# Patient Record
Sex: Male | Born: 1965 | Race: Black or African American | Hispanic: No | Marital: Married | Smoking: Never smoker
Health system: Southern US, Community
[De-identification: ages and names within clinical notes are randomized; demographics above are authoritative.]

---

## 2011-01-29 ENCOUNTER — Encounter: Payer: Self-pay | Admitting: Emergency Medicine

## 2011-01-29 ENCOUNTER — Emergency Department (HOSPITAL_COMMUNITY)
Admission: EM | Admit: 2011-01-29 | Discharge: 2011-01-29 | Disposition: A | Discharge status: Home / Self Care | Payer: PRIVATE HEALTH INSURANCE | Attending: Emergency Medicine | Admitting: Emergency Medicine

## 2011-01-29 DIAGNOSIS — R109 Unspecified abdominal pain: Secondary | ICD-10-CM | POA: Insufficient documentation

## 2011-01-29 DIAGNOSIS — R197 Diarrhea, unspecified: Secondary | ICD-10-CM

## 2011-01-29 LAB — COMPREHENSIVE METABOLIC PANEL
ALT: 20 U/L (ref 0–53)
AST: 19 U/L (ref 0–37)
CO2: 31 mEq/L (ref 19–32)
Calcium: 9.3 mg/dL (ref 8.4–10.5)
GFR calc Af Amer: 90 mL/min — ABNORMAL LOW (ref >90)
GFR calc non Af Amer: 77 mL/min — ABNORMAL LOW (ref >90)
Sodium: 140 mEq/L (ref 135–145)

## 2011-01-29 LAB — CBC
MCV: 82.6 fL (ref 78.0–100.0)
Platelets: 248 10*3/uL (ref 150–400)
RDW: 14.7 % (ref 11.5–15.5)
WBC: 3.7 10*3/uL — ABNORMAL LOW (ref 4.0–10.5)

## 2011-01-29 LAB — DIFFERENTIAL
Basophils Absolute: 0 10*3/uL (ref 0.0–0.1)
Eosinophils Relative: 6 % — ABNORMAL HIGH (ref 0–5)
Lymphocytes Relative: 48 % — ABNORMAL HIGH (ref 12–46)
Neutro Abs: 1.3 10*3/uL — ABNORMAL LOW (ref 1.7–7.7)
Neutrophils Relative %: 36 % — ABNORMAL LOW (ref 43–77)

## 2011-01-29 MED ORDER — LOPERAMIDE HCL 2 MG PO CAPS: 2.0000 mg | ORAL_CAPSULE | Freq: Four times a day (QID) | ORAL | Status: AC | PRN

## 2011-01-29 NOTE — ED Provider Notes (Signed)
History   This chart was scribed for Nicholes Stairs, MD by Clarita Crane. The patient was seen in room APA06/APA06 and the patient's care was started at 11:36AM.   CSN: [id number] Arrival date & time: 01/29/2011 10:33 AM   First MD Initiated Contact with Patient 01/29/11 1108      Chief Complaint  Patient presents with  . Abdominal Pain  . Diarrhea    HPI Allen Patterson is a 45 y.o. male who presents to the Emergency Department complaining of intermittent non-radiating diffuse abdominal pain onset 2 days ago and persistent since but improved this morning with associated diarrhea described as red in color. Denies nausea, vomiting, fever, chills. Denies abdominal surgery, recent sick contacts.  History reviewed. No pertinent past medical history.  History reviewed. No pertinent past surgical history.  History reviewed. No pertinent family history.  History  Substance Use Topics  . Smoking status: Never Smoker   . Smokeless tobacco: Not on file  . Alcohol Use: No      Review of Systems 10 Systems reviewed and are negative for acute change except as noted in the HPI.  Allergies  Review of patient's allergies indicates no known allergies.  Home Medications   Current Outpatient Rx  Name Route Sig Dispense Refill  . INFLUENZA VIRUS VACC SPLIT PF IM SUSP Intramuscular Inject 0.5 mLs into the muscle once.      Marland Kitchen OVER THE COUNTER MEDICATION Oral Take 2 tablets by mouth at bedtime as needed. Pain/insomnia       BP 163/97  Pulse 75  Temp(Src) 98.2 F (36.8 C) (Oral)  Resp 17  Ht 5\' 11"  (1.803 m)  Wt 225 lb (102.059 kg)  BMI 31.38 kg/m2  SpO2 100%  Physical Exam  Nursing note and vitals reviewed. Constitutional: He is oriented to person, place, and time. He appears well-developed and well-nourished. No distress.  HENT:  Head: Normocephalic and atraumatic.  Eyes: Conjunctivae and EOM are normal.  Neck: Neck supple. No tracheal deviation present.  Cardiovascular:  Normal rate and regular rhythm.  Exam reveals no friction rub.   No murmur heard. Pulmonary/Chest: Effort normal and breath sounds normal. No respiratory distress. He has no wheezes. He has no rales.  Abdominal: Soft. Bowel sounds are normal. He exhibits no distension. There is no tenderness.  Musculoskeletal: Normal range of motion. He exhibits no edema.  Neurological: He is alert and oriented to person, place, and time. No sensory deficit.  Skin: Skin is warm and dry.  Psychiatric: He has a normal mood and affect. His behavior is normal.    ED Course  Procedures (including critical care time)  DIAGNOSTIC STUDIES: Oxygen Saturation is 100% on room air, normal by my interpretation.    COORDINATION OF CARE:    Labs Reviewed  CBC - Abnormal; Notable for the following:    WBC 3.7 (*)    All other components within normal limits  DIFFERENTIAL - Abnormal; Notable for the following:    Neutrophils Relative 36 (*)    Neutro Abs 1.3 (*)    Lymphocytes Relative 48 (*)    Eosinophils Relative 6 (*)    All other components within normal limits  COMPREHENSIVE METABOLIC PANEL - Abnormal; Notable for the following:    Glucose, Bld 108 (*)    Total Bilirubin 0.2 (*)    GFR calc non Af Amer 77 (*)    GFR calc Af Amer 90 (*)    All other components within normal limits   No  results found.   No diagnosis found.    MDM  Diarrhea Resolved No abdominal pain now.  No fevers, no recent antibiotic use.       I personally performed the services described in this documentation, which was scribed in my presence. The recorded information has been reviewed and considered.    Nicholes Stairs, MD 01/29/11 1149

## 2011-01-29 NOTE — ED Notes (Signed)
Pt started having L side abd pain with mooshy diarrhea x 2 days ago. Pain to L side abd only with the bm's, states it starts to cramp. States his bm is a redish color but no red in toilet water. . Mm wet. Eating/drinking wnl.

## 2013-05-09 ENCOUNTER — Encounter (HOSPITAL_COMMUNITY): Payer: Self-pay | Admitting: Emergency Medicine

## 2013-05-09 ENCOUNTER — Emergency Department (HOSPITAL_COMMUNITY): Payer: PRIVATE HEALTH INSURANCE

## 2013-05-09 ENCOUNTER — Emergency Department (HOSPITAL_COMMUNITY)
Admission: EM | Admit: 2013-05-09 | Discharge: 2013-05-09 | Disposition: A | Discharge status: Home / Self Care | Payer: PRIVATE HEALTH INSURANCE | Attending: Emergency Medicine | Admitting: Emergency Medicine

## 2013-05-09 DIAGNOSIS — R51 Headache: Secondary | ICD-10-CM | POA: Insufficient documentation

## 2013-05-09 DIAGNOSIS — J069 Acute upper respiratory infection, unspecified: Secondary | ICD-10-CM

## 2013-05-09 MED ORDER — PSEUDOEPHEDRINE HCL 60 MG PO TABS
60.0000 mg | ORAL_TABLET | Freq: Once | ORAL | Status: AC
  Administered 2013-05-09: 60 mg via ORAL
  Filled 2013-05-09: qty 1

## 2013-05-09 MED ORDER — IBUPROFEN 800 MG PO TABS
800.0000 mg | ORAL_TABLET | Freq: Once | ORAL | Status: AC
  Administered 2013-05-09: 800 mg via ORAL
  Filled 2013-05-09: qty 1

## 2013-05-09 MED ORDER — PROMETHAZINE-CODEINE 6.25-10 MG/5ML PO SYRP
5.0000 mL | ORAL_SOLUTION | ORAL | Status: DC | PRN
Start: 2013-05-09 — End: 2014-09-07

## 2013-05-09 MED ORDER — LORATADINE-PSEUDOEPHEDRINE ER 5-120 MG PO TB12: 1.0000 | ORAL_TABLET | Freq: Two times a day (BID) | ORAL | Status: DC

## 2013-05-09 NOTE — Discharge Instructions (Signed)
Please increase fluids. Please wash hands frequently. Please use ibuprofen every 6 hours for fever and aching. Please use Claritin-D 2 times daily or every 12 hours for congestion. Use promethazine with codeine cough medication every 4 hours if needed for cough . Please use your mask until symptoms have resolved. Upper Respiratory Infection, Adult An upper respiratory infection (URI) is also known as the common cold. It is often caused by a type of germ (virus). Colds are easily spread (contagious). You can pass it to others by kissing, coughing, sneezing, or drinking out of the same glass. Usually, you get better in 1 or 2 weeks.  HOME CARE   Only take medicine as told by your doctor.  Use a warm mist humidifier or breathe in steam from a hot shower.  Drink enough water and fluids to keep your pee (urine) clear or pale yellow.  Get plenty of rest.  Return to work when your temperature is back to normal or as told by your doctor. You may use a face mask and wash your hands to stop your cold from spreading. GET HELP RIGHT AWAY IF:   After the first few days, you feel you are getting worse.  You have questions about your medicine.  You have chills, shortness of breath, or brown or red spit (mucus).  You have yellow or brown snot (nasal discharge) or pain in the face, especially when you bend forward.  You have a fever, puffy (swollen) neck, pain when you swallow, or white spots in the back of your throat.  You have a bad headache, ear pain, sinus pain, or chest pain.  You have a high-pitched whistling sound when you breathe in and out (wheezing).  You have a lasting cough or cough up blood.  You have sore muscles or a stiff neck. MAKE SURE YOU:   Understand these instructions.  Will watch your condition.  Will get help right away if you are not doing well or get worse. Document Released: 09/17/2007 Document Revised: 06/23/2011 Document Reviewed: 08/05/2010 [facility] Patient  Information 2014 [place]Care, [place].

## 2013-05-09 NOTE — ED Notes (Addendum)
Cough, fatigue and fever per pt 7 days.

## 2013-05-09 NOTE — ED Provider Notes (Signed)
CSN: [id number]     Arrival date & time 05/09/13  1348 History   First MD Initiated Contact with Patient 05/09/13 1616     Chief Complaint  Patient presents with  . Influenza   (Consider location/radiation/quality/duration/timing/severity/associated sxs/prior Treatment) Patient is a 48 y.o. male presenting with flu symptoms. The history is provided by the patient.  Influenza Presenting symptoms: cough, fatigue, fever and headache   Presenting symptoms: no nausea, no shortness of breath and no vomiting   Severity:  Moderate Progression:  Worsening Chronicity:  New Relieved by:  Nothing Ineffective treatments:  OTC medications Associated symptoms: chills, decreased physical activity and nasal congestion     History reviewed. No pertinent past medical history. History reviewed. No pertinent past surgical history. No family history on file. History  Substance Use Topics  . Smoking status: Never Smoker   . Smokeless tobacco: Not on file  . Alcohol Use: No    Review of Systems  Constitutional: Positive for fever, chills and fatigue. Negative for activity change.       All ROS Neg except as noted in HPI  HENT: Positive for congestion. Negative for nosebleeds.   Eyes: Negative for photophobia and discharge.  Respiratory: Positive for cough. Negative for shortness of breath and wheezing.   Cardiovascular: Negative for chest pain and palpitations.  Gastrointestinal: Negative for nausea, vomiting, abdominal pain and blood in stool.  Genitourinary: Negative for dysuria, frequency and hematuria.  Musculoskeletal: Negative for arthralgias, back pain and neck pain.  Skin: Negative.   Neurological: Positive for headaches. Negative for dizziness, seizures and speech difficulty.  Psychiatric/Behavioral: Negative for hallucinations and confusion.    Allergies  Review of patient's allergies indicates no known allergies.  Home Medications  No current outpatient prescriptions on file. BP  173/99  Pulse 83  Temp(Src) 99.2 F (37.3 C) (Oral)  Resp 18  SpO2 99% Physical Exam  Nursing note and vitals reviewed. Constitutional: He is oriented to person, place, and time. He appears well-developed and well-nourished.  Non-toxic appearance.  HENT:  Head: Normocephalic.  Right Ear: Tympanic membrane and external ear normal.  Left Ear: Tympanic membrane and external ear normal.  Nasal congestion  Eyes: EOM and lids are normal. Pupils are equal, round, and reactive to light.  Neck: Normal range of motion. Neck supple. Carotid bruit is not present.  Cardiovascular: Normal rate, regular rhythm, normal heart sounds, intact distal pulses and normal pulses.   Pulmonary/Chest: Breath sounds normal. No respiratory distress.  Few scattered rhonchi that clear with cough.  Abdominal: Soft. Bowel sounds are normal. There is no tenderness. There is no guarding.  Musculoskeletal: Normal range of motion.  Lymphadenopathy:       Head (right side): No submandibular adenopathy present.       Head (left side): No submandibular adenopathy present.    He has no cervical adenopathy.  Neurological: He is alert and oriented to person, place, and time. He has normal strength. No cranial nerve deficit or sensory deficit.  Skin: Skin is warm and dry.  Psychiatric: He has a normal mood and affect. His speech is normal.    ED Course  Procedures (including critical care time) Labs Review Labs Reviewed - No data to display Imaging Review No results found.  EKG Interpretation   None       MDM  No diagnosis found. *I have reviewed nursing notes, vital signs, and all appropriate lab and imaging results for this patient.**  Pt has URI. Pt advised to use  claritin D, Promethazine cough medication, and ibuprofen. Pt to see his primary or return to the ED if any problems.  Kathie DikeHobson M Daisha Filosa, PA-C 05/09/13 1655

## 2013-05-09 NOTE — ED Notes (Signed)
Sick for 1 week, cough with green sputum, Feels sob at times.  Had  Diarrhea today. No vomiting.

## 2013-05-09 NOTE — ED Provider Notes (Signed)
Medical screening examination/treatment/procedure(s) were performed by non-physician practitioner and as supervising physician I was immediately available for consultation/collaboration.  EKG Interpretation   None         Mohsen Odenthal L Anaisabel Pederson, MD 05/09/13 2027 

## 2014-09-07 ENCOUNTER — Emergency Department (HOSPITAL_COMMUNITY): Payer: PRIVATE HEALTH INSURANCE

## 2014-09-07 ENCOUNTER — Emergency Department (HOSPITAL_COMMUNITY)
Admission: EM | Admit: 2014-09-07 | Discharge: 2014-09-07 | Disposition: A | Discharge status: Home / Self Care | Payer: PRIVATE HEALTH INSURANCE | Attending: Emergency Medicine | Admitting: Emergency Medicine

## 2014-09-07 ENCOUNTER — Encounter (HOSPITAL_COMMUNITY): Payer: Self-pay

## 2014-09-07 DIAGNOSIS — Z79899 Other long term (current) drug therapy: Secondary | ICD-10-CM | POA: Insufficient documentation

## 2014-09-07 DIAGNOSIS — G8929 Other chronic pain: Secondary | ICD-10-CM | POA: Diagnosis not present

## 2014-09-07 DIAGNOSIS — M25561 Pain in right knee: Secondary | ICD-10-CM

## 2014-09-07 MED ORDER — DICLOFENAC SODIUM 75 MG PO TBEC: 75.0000 mg | DELAYED_RELEASE_TABLET | Freq: Two times a day (BID) | ORAL | Status: DC

## 2014-09-07 NOTE — ED Notes (Signed)
Pt reports left knee pain that got worse last night at work.  Denies injury.

## 2014-09-07 NOTE — Discharge Instructions (Signed)
Knee Pain °The knee is the complex joint between your thigh and your lower leg. It is made up of bones, tendons, ligaments, and cartilage. The bones that make up the knee are: °· The femur in the thigh. °· The tibia and fibula in the lower leg. °· The patella or kneecap riding in the groove on the lower femur. °CAUSES  °Knee pain is a common complaint with many causes. A few of these causes are: °· Injury, such as: °· A ruptured ligament or tendon injury. °· Torn cartilage. °· Medical conditions, such as: °· Gout °· Arthritis °· Infections °· Overuse, over training, or overdoing a physical activity. °Knee pain can be minor or severe. Knee pain can accompany debilitating injury. Minor knee problems often respond well to self-care measures or get well on their own. More serious injuries may need medical intervention or even surgery. °SYMPTOMS °The knee is complex. Symptoms of knee problems can vary widely. Some of the problems are: °· Pain with movement and weight bearing. °· Swelling and tenderness. °· Buckling of the knee. °· Inability to straighten or extend your knee. °· Your knee locks and you cannot straighten it. °· Warmth and redness with pain and fever. °· Deformity or dislocation of the kneecap. °DIAGNOSIS  °Determining what is wrong may be very straight forward such as when there is an injury. It can also be challenging because of the complexity of the knee. Tests to make a diagnosis may include: °· Your caregiver taking a history and doing a physical exam. °· Routine X-rays can be used to rule out other problems. X-rays will not reveal a cartilage tear. Some injuries of the knee can be diagnosed by: °¨ Arthroscopy a surgical technique by which a small video camera is inserted through tiny incisions on the sides of the knee. This procedure is used to examine and repair internal knee joint problems. Tiny instruments can be used during arthroscopy to repair the torn knee cartilage (meniscus). °¨ Arthrography  is a radiology technique. A contrast liquid is directly injected into the knee joint. Internal structures of the knee joint then become visible on X-ray film. °¨ An MRI scan is a non X-ray radiology procedure in which magnetic fields and a computer produce two- or three-dimensional images of the inside of the knee. Cartilage tears are often visible using an MRI scanner. MRI scans have largely replaced arthrography in diagnosing cartilage tears of the knee. °· Blood work. °· Examination of the fluid that helps to lubricate the knee joint (synovial fluid). This is done by taking a sample out using a needle and a syringe. °TREATMENT °The treatment of knee problems depends on the cause. Some of these treatments are: °· Depending on the injury, proper casting, splinting, surgery, or physical therapy care will be needed. °· Give yourself adequate recovery time. Do not overuse your joints. If you begin to get sore during workout routines, back off. Slow down or do fewer repetitions. °· For repetitive activities such as cycling or running, maintain your strength and nutrition. °· Alternate muscle groups. For example, if you are a weight lifter, work the upper body on one day and the lower body the next. °· Either tight or weak muscles do not give the proper support for your knee. Tight or weak muscles do not absorb the stress placed on the knee joint. Keep the muscles surrounding the knee strong. °· Take care of mechanical problems. °¨ If you have flat feet, orthotics or special shoes may help.   See your caregiver if you need help. °¨ Arch supports, sometimes with wedges on the inner or outer aspect of the heel, can help. These can shift pressure away from the side of the knee most bothered by osteoarthritis. °¨ A brace called an "unloader" brace also may be used to help ease the pressure on the most arthritic side of the knee. °· If your caregiver has prescribed crutches, braces, wraps or ice, use as directed. The acronym  for this is PRICE. This means protection, rest, ice, compression, and elevation. °· Nonsteroidal anti-inflammatory drugs (NSAIDs), can help relieve pain. But if taken immediately after an injury, they may actually increase swelling. Take NSAIDs with food in your stomach. Stop them if you develop stomach problems. Do not take these if you have a history of ulcers, stomach pain, or bleeding from the bowel. Do not take without your caregiver's approval if you have problems with fluid retention, heart failure, or kidney problems. °· For ongoing knee problems, physical therapy may be helpful. °· Glucosamine and chondroitin are over-the-counter dietary supplements. Both may help relieve the pain of osteoarthritis in the knee. These medicines are different from the usual anti-inflammatory drugs. Glucosamine may decrease the rate of cartilage destruction. °· Injections of a corticosteroid drug into your knee joint may help reduce the symptoms of an arthritis flare-up. They may provide pain relief that lasts a few months. You may have to wait a few months between injections. The injections do have a small increased risk of infection, water retention, and elevated blood sugar levels. °· Hyaluronic acid injected into damaged joints may ease pain and provide lubrication. These injections may work by reducing inflammation. A series of shots may give relief for as long as 6 months. °· Topical painkillers. Applying certain ointments to your skin may help relieve the pain and stiffness of osteoarthritis. Ask your pharmacist for suggestions. Many over the-counter products are approved for temporary relief of arthritis pain. °· In some countries, doctors often prescribe topical NSAIDs for relief of chronic conditions such as arthritis and tendinitis. A review of treatment with NSAID creams found that they worked as well as oral medications but without the serious side effects. °PREVENTION °· Maintain a healthy weight. Extra pounds  put more strain on your joints. °· Get strong, stay limber. Weak muscles are a common cause of knee injuries. Stretching is important. Include flexibility exercises in your workouts. °· Be smart about exercise. If you have osteoarthritis, chronic knee pain or recurring injuries, you may need to change the way you exercise. This does not mean you have to stop being active. If your knees ache after jogging or playing basketball, consider switching to swimming, water aerobics, or other low-impact activities, at least for a few days a week. Sometimes limiting high-impact activities will provide relief. °· Make sure your shoes fit well. Choose footwear that is right for your sport. °· Protect your knees. Use the proper gear for knee-sensitive activities. Use kneepads when playing volleyball or laying carpet. Buckle your seat belt every time you drive. Most shattered kneecaps occur in car accidents. °· Rest when you are tired. °SEEK MEDICAL CARE IF:  °You have knee pain that is continual and does not seem to be getting better.  °SEEK IMMEDIATE MEDICAL CARE IF:  °Your knee joint feels hot to the touch and you have a high fever. °MAKE SURE YOU:  °· Understand these instructions. °· Will watch your condition. °· Will get help right away if you are not   doing well or get worse. Document Released: 01/26/2007 Document Revised: 06/23/2011 Document Reviewed: 01/26/2007 [facility] Patient Information 2015 [place], [place]. This information is not intended to replace advice given to you by your health care provider. Make sure you discuss any questions you have with your health care provider.  Arthritis, Nonspecific Arthritis is pain, redness, warmth, or puffiness (inflammation) of a joint. The joint may be stiff or hurt when you move it. One or more joints may be affected. There are many types of arthritis. Your doctor may not know what type you have right away. The most common cause of arthritis is wear and tear on the joint  (osteoarthritis). HOME CARE   Only take medicine as told by your doctor.  Rest the joint as much as possible.  Raise (elevate) your joint if it is puffy.  Use crutches if the painful joint is in your leg.  Drink enough fluids to keep your pee (urine) clear or pale yellow.  Follow your doctor's diet instructions.  Use cold packs for very bad joint pain for 10 to 15 minutes every hour. Ask your doctor if it is okay for you to use hot packs.  Exercise as told by your doctor.  Take a warm shower if you have stiffness in the morning.  Move your sore joints throughout the day. GET HELP RIGHT AWAY IF:   You have a fever.  You have very bad joint pain, puffiness, or redness.  You have many joints that are painful and puffy.  You are not getting better with treatment.  You have very bad back pain or leg weakness.  You cannot control when you poop (bowel movement) or pee (urinate).  You do not feel better in 24 hours or are getting worse.  You are having side effects from your medicine. MAKE SURE YOU:   Understand these instructions.  Will watch your condition.  Will get help right away if you are not doing well or get worse. Document Released: 06/25/2009 Document Revised: 09/30/2011 Document Reviewed: 06/25/2009 [facility]Exit[facility] Patient Information 2015 [place]ExitCare, [place]LL[place]. This information is not intended to replace advice given to you by your health care provider. Make sure you discuss any questions you have with your health care provider.

## 2014-09-07 NOTE — ED Provider Notes (Signed)
CSN: [id number]     Arrival date & time 09/07/14  1645 History   First MD Initiated Contact with Patient 09/07/14 1702     Chief Complaint  Patient presents with  . Knee Pain     (Consider location/radiation/quality/duration/timing/severity/associated sxs/prior Treatment) HPI   Allen Patterson is a 49 y.o. male who presents to the Emergency Department complaining of worsening of his chronic left knee pain.  He reports occasional pain to the knee with weight bearing, but pain increased last evening while at work and since then pain has been constant and more intense.  He reports "feeling like it going to give out on me."  Pain is only with weight bearing.  He has not tried any therapies prior to arrival.  He denies swelling, fever, chills, redness, numbness or weakness of the leg.   History reviewed. No pertinent past medical history. History reviewed. No pertinent past surgical history. No family history on file. History  Substance Use Topics  . Smoking status: Never Smoker   . Smokeless tobacco: Not on file  . Alcohol Use: No    Review of Systems  Constitutional: Negative for fever and chills.  Genitourinary: Negative for dysuria and difficulty urinating.  Musculoskeletal: Positive for arthralgias (left knee pain). Negative for joint swelling.  Skin: Negative for color change and wound.  All other systems reviewed and are negative.     Allergies  Review of patient's allergies indicates no known allergies.  Home Medications   Prior to Admission medications   Medication Sig Start Date End Date Taking? Authorizing Provider  loratadine-pseudoephedrine (CLARITIN-D 12 HOUR) 5-120 MG per tablet Take 1 tablet by mouth 2 (two) times daily. 05/09/13   Ivery QualeHobson Bryant, PA-C  promethazine-codeine (PHENERGAN WITH CODEINE) 6.25-10 MG/5ML syrup Take 5 mLs by mouth every 4 (four) hours as needed for cough. 05/09/13   Ivery QualeHobson Bryant, PA-C   BP 159/109 mmHg  Temp(Src) 99.5 F (37.5 C) (Oral)   Resp 18  Ht 5\' 11"  (1.803 m)  Wt 250 lb (113.399 kg)  BMI 34.88 kg/m2  SpO2 99%   Physical Exam  Constitutional: He is oriented to person, place, and time. He appears well-developed and well-nourished. No distress.  Cardiovascular: Normal rate, regular rhythm, normal heart sounds and intact distal pulses.   No murmur heard. Pulmonary/Chest: Effort normal and breath sounds normal.  Musculoskeletal: Normal range of motion. He exhibits tenderness. He exhibits no edema.  Pt has full, non-tender ROM of the left knee joint  No erythema, effusion, or step-off deformity.  DP pulse brisk, distal sensation intact. Calf is soft and NT.  Neurological: He is alert and oriented to person, place, and time. He exhibits normal muscle tone. Coordination normal.  Skin: Skin is warm and dry. No erythema.  Nursing note and vitals reviewed.   ED Course  Procedures (including critical care time) Labs Review Labs Reviewed - No data to display  Imaging Review Dg Knee Complete 4 Views Left  09/07/2014   CLINICAL DATA:  49 year old male with chronic left knee pain for 2 years.  EXAM: LEFT KNEE - COMPLETE 4+ VIEW  COMPARISON:  None.  FINDINGS: There is no evidence fracture, subluxation or dislocation.  Minimal beaking of the tibial spines is noted.  There is no evidence of joint effusion.  No other joint abnormalities for noted.  IMPRESSION: Minimal degenerative changes without other significant abnormality.   Electronically Signed   By: Harmon PierJeffrey  Hu M.D.   On: 09/07/2014 17:32     EKG  Interpretation None      MDM   Final diagnoses:  Knee pain, right    Pt with nml exam of the left knee.  ACE wrap applied for support.  Agrees to close orthopedic f/u .  Pain improved, remains NV intact.    Pauline Aus, PA-C 09/07/14 1759  Donnetta Hutching, MD 09/07/14 Serena Croissant

## 2015-02-27 ENCOUNTER — Emergency Department (HOSPITAL_COMMUNITY)
Admission: EM | Admit: 2015-02-27 | Discharge: 2015-02-28 | Disposition: A | Discharge status: Home / Self Care | Payer: PRIVATE HEALTH INSURANCE | Attending: Emergency Medicine | Admitting: Emergency Medicine

## 2015-02-27 ENCOUNTER — Emergency Department (HOSPITAL_COMMUNITY): Payer: PRIVATE HEALTH INSURANCE

## 2015-02-27 ENCOUNTER — Encounter (HOSPITAL_COMMUNITY): Payer: Self-pay | Admitting: *Deleted

## 2015-02-27 DIAGNOSIS — B353 Tinea pedis: Secondary | ICD-10-CM | POA: Insufficient documentation

## 2015-02-27 DIAGNOSIS — R21 Rash and other nonspecific skin eruption: Secondary | ICD-10-CM | POA: Diagnosis present

## 2015-02-27 DIAGNOSIS — M79671 Pain in right foot: Secondary | ICD-10-CM

## 2015-02-27 MED ORDER — DOXYCYCLINE HYCLATE 100 MG PO TABS
100.0000 mg | ORAL_TABLET | Freq: Once | ORAL | Status: AC
  Administered 2015-02-27: 100 mg via ORAL
  Filled 2015-02-27: qty 1

## 2015-02-27 MED ORDER — DOXYCYCLINE HYCLATE 100 MG PO CAPS: 100.0000 mg | ORAL_CAPSULE | Freq: Two times a day (BID) | ORAL | Status: AC

## 2015-02-27 MED ORDER — CLOTRIMAZOLE 1 % EX CREA: TOPICAL_CREAM | CUTANEOUS | Status: DC

## 2015-02-27 MED ORDER — KETOROLAC TROMETHAMINE 60 MG/2ML IM SOLN
60.0000 mg | Freq: Once | INTRAMUSCULAR | Status: AC
  Administered 2015-02-27: 60 mg via INTRAMUSCULAR
  Filled 2015-02-27: qty 2

## 2015-02-27 MED ORDER — NAPROXEN 500 MG PO TABS: 500.0000 mg | ORAL_TABLET | Freq: Two times a day (BID) | ORAL | Status: AC

## 2015-02-27 NOTE — ED Provider Notes (Signed)
CSN: [id number]     Arrival date & time 02/27/15  2108 History  By signing my name below, I, Lyndel Safe, attest that this documentation has been prepared under the direction and in the presence of Vanetta Mulders, MD. Electronically Signed: Lyndel Safe, ED Scribe. 02/27/2015. 10:19 PM.   Chief Complaint  Patient presents with  . Leg Swelling    Patient is a 49 y.o. male presenting with lower extremity pain. The history is provided by the patient. No language interpreter was used.  Foot Pain This is a new problem. The current episode started 2 days ago. The problem occurs constantly. The problem has not changed since onset.Pertinent negatives include no chest pain, no abdominal pain, no headaches and no shortness of breath. The symptoms are aggravated by walking. Nothing relieves the symptoms. Treatments tried: neosporin. The treatment provided no relief.   HPI Comments: Elfego Giammarino is a 49 y.o. male, with no pertinent PMhx, who presents to the Emergency Department complaining of constant, 10/10 pain to dorsum and soles of bilateral feet onset 2 days ago and that is greater in the right foot. He denies any injury attributable to his pain. Pt associates swelling to dorsum of right foot but denies swelling to his ankles. His pain is worse with weight bearing. Pt has applied neosporin without significant relief.   History reviewed. No pertinent past medical history. History reviewed. No pertinent past surgical history. No family history on file. Social History  Substance Use Topics  . Smoking status: Never Smoker   . Smokeless tobacco: None  . Alcohol Use: No    Review of Systems  Constitutional: Negative for fever.  HENT: Negative for congestion, rhinorrhea and sore throat.   Eyes: Negative for visual disturbance.  Respiratory: Negative for cough and shortness of breath.   Cardiovascular: Positive for leg swelling ( right foot edema). Negative for chest pain.   Gastrointestinal: Negative for nausea, vomiting, abdominal pain and diarrhea.  Genitourinary: Negative for dysuria and hematuria.  Musculoskeletal: Negative for back pain.  Skin: Positive for rash.  Neurological: Negative for headaches.  Hematological: Does not bruise/bleed easily.  Psychiatric/Behavioral: Negative for confusion.   Allergies  Review of patient's allergies indicates no known allergies.  Home Medications   Prior to Admission medications   Medication Sig Start Date End Date Taking? Authorizing Provider  diphenhydramine-acetaminophen (TYLENOL PM) 25-500 MG TABS tablet Take 1-2 tablets by mouth at bedtime as needed (for sleep).   Yes Historical Provider, MD  clotrimazole (LOTRIMIN) 1 % cream Apply to affected area 2 times daily 02/27/15   Vanetta Mulders, MD  doxycycline (VIBRAMYCIN) 100 MG capsule Take 1 capsule (100 mg total) by mouth 2 (two) times daily. 02/27/15   Vanetta Mulders, MD  naproxen (NAPROSYN) 500 MG tablet Take 1 tablet (500 mg total) by mouth 2 (two) times daily. 02/27/15   Vanetta Mulders, MD   BP 148/117 mmHg  Pulse 120  Temp(Src) 98.6 F (37 C) (Oral)  Resp 18  Ht  (1.803 m)  Wt 230 lb (104.327 kg)  BMI 32.09 kg/m2  SpO2 100% Physical Exam  Constitutional: He is oriented to person, place, and time. He appears well-developed and well-nourished. No distress.  HENT:  Head: Normocephalic and atraumatic.  Mouth/Throat: Oropharynx is clear and moist.  Moist mucous membranes.   Eyes: Conjunctivae and EOM are normal. Pupils are equal, round, and reactive to light. No scleral icterus.  Neck: Normal range of motion. Neck supple.  Cardiovascular: Normal rate, regular rhythm and  normal heart sounds.   Pulmonary/Chest: Effort normal and breath sounds normal. No respiratory distress. He has no wheezes.  Abdominal: Soft. Bowel sounds are normal. There is no tenderness.  Musculoskeletal: Normal range of motion. He exhibits no edema.  No edema to  bilateral ankles. Left foot; DP 2+, cap refill is 1 second; skin maceration with open wounds between all the toes. Right foot; DP 2+, cap refill 1 second; swelling to dorsum of foot with increased warmth to dorsum of foot and mild erythema; maceration and peeling in between toes and to the sole of the right foot.   Neurological: He is alert and oriented to person, place, and time. No cranial nerve deficit. He exhibits normal muscle tone. Coordination normal.  Skin: Skin is warm and dry. There is erythema.  Psychiatric: He has a normal mood and affect. His behavior is normal.  Nursing note and vitals reviewed.   ED Course  Procedures  DIAGNOSTIC STUDIES: Oxygen Saturation is 100% on RA, normal by my interpretation.    COORDINATION OF CARE: 10:17 PM Discussed treatment plan with pt at bedside and pt agreed to plan. Will order Xray of right foot.   Imaging Review Dg Foot Complete Right  02/27/2015  CLINICAL DATA:  Right foot pain and swelling for 2 days. No known injury. EXAM: RIGHT FOOT COMPLETE - 3+ VIEW COMPARISON:  None. FINDINGS: No fracture or dislocation. The alignment and joint spaces are maintained. No erosion, periosteal reaction, or bony destructive change. Os peroneal incidentally noted. Tiny Achilles tendon enthesophyte. Mild soft tissue edema. No radiopaque foreign body. IMPRESSION: Mild soft tissue edema.  No acute bony abnormality. Electronically Signed   By: Rubye OaksMelanie  Ehinger M.D.   On: 02/27/2015 22:48   I have personally reviewed and evaluated these images as part of my medical decision-making.   MDM   Final diagnoses:  Foot pain, right  Tinea pedis of both feet    Patient here with pain to the right foot and swelling on the dorsum of the forefoot. Patient has bilateral fairly significant athlete's foot disease right foot worse than left. The redness and inflammation on top of the foot could be due to a secondary infection and will treat with doxycycline or could even be  due to a gouty type arthritis, x-ray of the foot however is negative. Will treat with anti-inflammatories antifungal and antibiotics. Have arranged follow-up of with [facility]. Patient will return for any new or worse symptoms. Patient will soak the foot daily for 20 minutes.  I personally performed the services described in this documentation, which was scribed in my presence. The recorded information has been reviewed and is accurate.      Vanetta MuldersScott Remi Rester, MD 02/27/15 [phone]

## 2015-02-27 NOTE — ED Notes (Signed)
Pt states swelling in both feet that started Sunday, right worse than the left. Denies any injury.

## 2015-02-27 NOTE — Discharge Instructions (Signed)
Athlete's Foot  Athlete's foot is a skin infection caused by a fungus. Athlete's foot is often seen between or under the toes. It can also be seen on the bottom of the foot. Athlete's foot can spread to other people by sharing towels or shower stalls. HOME CARE  Only take medicines as told by your doctor. Do not use steroid creams.  Wash your feet daily. Dry your feet well, especially between the toes.  Change your socks every day. Wear cotton or wool socks.  Change your socks 2 to 3 times a day in hot weather.  Wear sandals or canvas tennis shoes with good airflow.  If you have blisters, soak your feet in a solution as told by your doctor. Do this for 20 to 30 minutes, 2 times a day. Dry your feet well after you soak them.  Do not share towels.  Wear sandals when you use shared locker rooms or showers. GET HELP RIGHT AWAY IF:   You have a fever.  Your foot is puffy (swollen), sore, warm, or red.  You are not getting better after 7 days of treatment.  You still have athlete's foot after 30 days.  You have problems caused by your medicine. MAKE SURE YOU:   Understand these instructions.  Will watch your condition.  Will get help right away if you are not doing well or get worse.   This information is not intended to replace advice given to you by your health care provider. Make sure you discuss any questions you have with your health care provider.  For the athletes foot infection soak daily for 20 minutes in warm water can add Epson salt if you want. Apply the antifungal cream Lotrimin as directed. Take the Naprosyn for the pain. Take the antibiotic in case there is a secondary infection as directed. Follow-up with the [facility] referral information provided. Return for any new or worse symptoms.   Document Released: 09/17/2007 Document Revised: 06/23/2011 Document Reviewed: 10/02/2014 Elsevier Interactive Patient Education [employer]2016 Elsevier Inc.

## 2015-03-31 ENCOUNTER — Emergency Department (HOSPITAL_COMMUNITY)
Admission: EM | Admit: 2015-03-31 | Discharge: 2015-03-31 | Disposition: A | Discharge status: Home / Self Care | Payer: PRIVATE HEALTH INSURANCE | Attending: Emergency Medicine | Admitting: Emergency Medicine

## 2015-03-31 ENCOUNTER — Encounter (HOSPITAL_COMMUNITY): Payer: Self-pay | Admitting: Emergency Medicine

## 2015-03-31 DIAGNOSIS — IMO0002 Reserved for concepts with insufficient information to code with codable children: Secondary | ICD-10-CM

## 2015-03-31 DIAGNOSIS — L03116 Cellulitis of left lower limb: Secondary | ICD-10-CM | POA: Diagnosis not present

## 2015-03-31 DIAGNOSIS — L02416 Cutaneous abscess of left lower limb: Secondary | ICD-10-CM | POA: Diagnosis not present

## 2015-03-31 DIAGNOSIS — M79662 Pain in left lower leg: Secondary | ICD-10-CM | POA: Diagnosis present

## 2015-03-31 MED ORDER — SULFAMETHOXAZOLE-TRIMETHOPRIM 800-160 MG PO TABS: 1.0000 | ORAL_TABLET | Freq: Two times a day (BID) | ORAL | Status: AC

## 2015-03-31 MED ORDER — CEPHALEXIN 500 MG PO CAPS
500.0000 mg | ORAL_CAPSULE | Freq: Four times a day (QID) | ORAL | Status: AC
Start: 2015-03-31 — End: ?

## 2015-03-31 MED ORDER — HYDROCODONE-ACETAMINOPHEN 5-325 MG PO TABS: 1.0000 | ORAL_TABLET | ORAL | Status: AC | PRN

## 2015-03-31 NOTE — Discharge Instructions (Signed)

## 2015-03-31 NOTE — ED Notes (Signed)
Adaptic and 4x4 dressings applied to each wound on lower extremities.

## 2015-03-31 NOTE — ED Notes (Signed)
Patient c/o abscess to lower legs bilaterally x1 week that has progressively gotten worse. Per patient slight temp last night. Patient reports serosanguinous drainage. 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

## 2015-04-03 LAB — CULTURE, ROUTINE-ABSCESS

## 2015-04-03 NOTE — ED Provider Notes (Signed)
CSN: [id number]     Arrival date & time 03/31/15  1312 History   First MD Initiated Contact with Patient 03/31/15 1338     Chief Complaint  Patient presents with  . Abscess     (Consider location/radiation/quality/duration/timing/severity/associated sxs/prior Treatment) The history is provided by the patient.   Allen Patterson is a 49 y.o. male presenting with a one week history of lower leg abscesses. One week ago he developed a small "bump" on his left lower lateral leg which has become enlarged, painful until it burst 2 days ago and is now improving in both pain and size.  He developed a similar lesion on his right lower leg 4 days ago and this one started draining yesterday, although he endorses this lesion is still fairly painful.  He was subjectively febrile last night but not today.  He has taken no medicines for his symptoms but has been employing warm soaks.  He denies prior history of similar symptoms but recalls his coworker showing him a similar infection on his knee about 2 weeks ago.    History reviewed. No pertinent past medical history. History reviewed. No pertinent past surgical history. History reviewed. No pertinent family history. Social History  Substance Use Topics  . Smoking status: Never Smoker   . Smokeless tobacco: Never Used  . Alcohol Use: No    Review of Systems  Constitutional: Negative for fever and chills.  Respiratory: Negative for shortness of breath and wheezing.   Skin: Positive for color change and wound.  Neurological: Negative for numbness.      Allergies  Review of patient's allergies indicates no known allergies.  Home Medications   Prior to Admission medications   Medication Sig Start Date End Date Taking? Authorizing Provider  diphenhydramine-acetaminophen (TYLENOL PM) 25-500 MG TABS tablet Take 1-2 tablets by mouth at bedtime as needed (for sleep).   Yes Historical Provider, MD  cephALEXin (KEFLEX) 500 MG capsule Take 1 capsule  (500 mg total) by mouth 4 (four) times daily. 03/31/15   Burgess Amor, PA-C  doxycycline (VIBRAMYCIN) 100 MG capsule Take 1 capsule (100 mg total) by mouth 2 (two) times daily. Patient not taking: Reported on 03/31/2015 02/27/15   Vanetta Mulders, MD  HYDROcodone-acetaminophen (NORCO/VICODIN) 5-325 MG tablet Take 1 tablet by mouth every 4 (four) hours as needed. 03/31/15   Burgess Amor, PA-C  naproxen (NAPROSYN) 500 MG tablet Take 1 tablet (500 mg total) by mouth 2 (two) times daily. Patient not taking: Reported on 03/31/2015 02/27/15   Vanetta Mulders, MD  sulfamethoxazole-trimethoprim (BACTRIM DS,SEPTRA DS) 800-160 MG tablet Take 1 tablet by mouth 2 (two) times daily. 03/31/15 04/07/15  Burgess Amor, PA-C   BP 158/98 mmHg  Pulse 103  Temp(Src) 98.6 F (37 C) (Oral)  Resp 20  Ht  (1.803 m)  Wt 106.595 kg  BMI 32.79 kg/m2  SpO2 99% Physical Exam  Constitutional: He is oriented to person, place, and time. He appears well-developed and well-nourished.  HENT:  Head: Normocephalic.  Cardiovascular: Normal rate.   Pulmonary/Chest: Effort normal.  Neurological: He is alert and oriented to person, place, and time. No sensory deficit.  Skin:  Macular lesion left lateral lower leg, central punctum with scant purulent drainage and 2 cm surrounding erythema which is mildly pink, fairly nontender.  No induration or fluctuance.  The right lesion is also drainage purulence,  Erythema more pronounced approximately 4 cm surrounding raised, fluctuant punctum.  Active drainage. No red streaking.    ED Course  Procedures (including critical care time) Labs Review Labs Reviewed  CULTURE, ROUTINE-ABSCESS    Imaging Review No results found. I have personally reviewed and evaluated these images and lab results as part of my medical decision-making.   EKG Interpretation None      MDM   Final diagnoses:  Abscess or cellulitis of leg    Discussed I & D of right abscess given it is still  fairly fluctuant and I suspect there is still a moderate amount of purulence present,  The left is already improved, this one should continue to resolve with warm soaks.  Pt defers procedure today, would rather try abx and continue warm soaks.  Cautioned pt to return for a recheck if either lesion continues to enlarge or he develops any new or persistent sx, fevers, pain, increased spreading redness etc.  No fever today, he has not had any antipyretics. He was prescribed bactrim and hydrocodone.    Burgess AmorJulie Sarika Baldini, PA-C 04/03/15 [phone]  Glynn OctaveStephen Rancour, MD 04/03/15 [phone]

## 2015-04-04 ENCOUNTER — Telehealth (HOSPITAL_COMMUNITY): Payer: Self-pay

## 2015-04-04 NOTE — Telephone Encounter (Signed)
Post ED Visit - Positive Culture Follow-up  Culture report reviewed by antimicrobial stewardship pharmacist:  [x]  Enzo BiNathan Batchelder, Pharm.D. []  Celedonio MiyamotoJeremy Frens, Pharm.D., BCPS []  Garvin FilaMike Maccia, Pharm.D. []  Georgina PillionElizabeth Martin, Pharm.D., BCPS []  TetoniaMinh Pham, [address]Pharm[address]D., BCPS, AAHIVP []  Estella HuskMichelle Turner, Pharm.D., BCPS, AAHIVP []  Tennis Mustassie Stewart, Pharm.D. []  Sherle Poeob Vincent, [address]Pharm[address]  Positive abscess culture -> abundant staph. aureus Treated with Sulfa-Trimeth, organism sensitive to the same and no further patient follow-up is required at this time.  Arvid RightClark, Kailo Kosik Dorn 04/04/2015, 9:57 AM

## 2015-05-20 ENCOUNTER — Encounter (HOSPITAL_COMMUNITY): Payer: Self-pay | Admitting: *Deleted

## 2015-05-20 ENCOUNTER — Emergency Department (HOSPITAL_COMMUNITY)
Admission: EM | Admit: 2015-05-20 | Discharge: 2015-05-20 | Disposition: A | Discharge status: Home / Self Care | Payer: PRIVATE HEALTH INSURANCE | Attending: Emergency Medicine | Admitting: Emergency Medicine

## 2015-05-20 DIAGNOSIS — R21 Rash and other nonspecific skin eruption: Secondary | ICD-10-CM | POA: Diagnosis present

## 2015-05-20 DIAGNOSIS — L089 Local infection of the skin and subcutaneous tissue, unspecified: Secondary | ICD-10-CM

## 2015-05-20 DIAGNOSIS — Z79899 Other long term (current) drug therapy: Secondary | ICD-10-CM | POA: Diagnosis not present

## 2015-05-20 LAB — CBG MONITORING, ED: Glucose-Capillary: 104 mg/dL — ABNORMAL HIGH (ref 65–99)

## 2015-05-20 MED ORDER — SULFAMETHOXAZOLE-TRIMETHOPRIM 800-160 MG PO TABS: 1.0000 | ORAL_TABLET | Freq: Two times a day (BID) | ORAL | Status: AC

## 2015-05-20 NOTE — ED Provider Notes (Signed)
CSN: [id number]     Arrival date & time 05/20/15  4098 History   First MD Initiated Contact with Patient 05/20/15 [phone]     Chief Complaint  Patient presents with  . Skin Infection      (Consider location/radiation/quality/duration/timing/severity/associated sxs/prior Treatment) Patient is a 50 y.o. male presenting with rash. The history is provided by the patient. No language interpreter was used.  Rash Location:  Full body Quality: itchiness   Severity:  Moderate Onset quality:  Gradual Timing:  Constant Progression:  Worsening Chronicity:  New Context: not insect bite/sting   Relieved by:  Nothing Ineffective treatments:  None tried Associated symptoms: no nausea   Pt complains of multiple sores.  Pt reports he has had before  History reviewed. No pertinent past medical history. History reviewed. No pertinent past surgical history. No family history on file. Social History  Substance Use Topics  . Smoking status: Never Smoker   . Smokeless tobacco: Never Used  . Alcohol Use: No    Review of Systems  Gastrointestinal: Negative for nausea.  Skin: Positive for rash.  All other systems reviewed and are negative.     Allergies  Review of patient's allergies indicates no known allergies.  Home Medications   Prior to Admission medications   Medication Sig Start Date End Date Taking? Authorizing Provider  Phenyleph-Doxylamine-DM-APAP (NYQUIL SEVERE COLD/FLU) 5-6.25-10-325 MG/15ML LIQD Take 10 mLs by mouth 2 (two) times daily.   Yes Historical Provider, MD  cephALEXin (KEFLEX) 500 MG capsule Take 1 capsule (500 mg total) by mouth 4 (four) times daily. Patient not taking: Reported on 05/20/2015 03/31/15   Burgess Amor, PA-C  diphenhydramine-acetaminophen (TYLENOL PM) 25-500 MG TABS tablet Take 1-2 tablets by mouth at bedtime as needed (for sleep). Reported on 05/20/2015    Historical Provider, MD  doxycycline (VIBRAMYCIN) 100 MG capsule Take 1 capsule (100 mg total) by mouth 2  (two) times daily. Patient not taking: Reported on 03/31/2015 02/27/15   Vanetta Mulders, MD  HYDROcodone-acetaminophen (NORCO/VICODIN) 5-325 MG tablet Take 1 tablet by mouth every 4 (four) hours as needed. Patient not taking: Reported on 05/20/2015 03/31/15   Burgess Amor, PA-C  naproxen (NAPROSYN) 500 MG tablet Take 1 tablet (500 mg total) by mouth 2 (two) times daily. Patient not taking: Reported on 03/31/2015 02/27/15   Vanetta Mulders, MD   BP 149/95 mmHg  Pulse 88  Temp(Src) 98.6 F (37 C) (Oral)  Resp 15  Ht  (1.803 m)  Wt 106.595 kg  BMI 32.79 kg/m2  SpO2 100% Physical Exam  Constitutional: He is oriented to person, place, and time. He appears well-developed and well-nourished.  HENT:  Head: Normocephalic and atraumatic.  Right Ear: External ear normal.  Left Ear: External ear normal.  Mouth/Throat: Oropharynx is clear and moist.  Eyes: Conjunctivae and EOM are normal. Pupils are equal, round, and reactive to light.  Neck: Normal range of motion.  Cardiovascular: Normal rate and normal heart sounds.   Pulmonary/Chest: Effort normal.  Abdominal: He exhibits no distension.  Musculoskeletal: Normal range of motion.  Neurological: He is alert and oriented to person, place, and time.  Skin:  Multiple scabbing wounds. Open draining area right lower leg  Psychiatric: He has a normal mood and affect.  Nursing note and vitals reviewed.   ED Course  Procedures (including critical care time) Labs Review Labs Reviewed - No data to display  Imaging Review No results found. I have personally reviewed and evaluated these images and lab results as  part of my medical decision-making.   EKG Interpretation None      MDM   Final diagnoses:  Skin infection    Bactrim ds 14 bid    Lonia Skinner [place], PA-C 05/20/15 1033  Doug Sou, MD 05/20/15 [phone]

## 2015-05-20 NOTE — Discharge Instructions (Signed)
Fingertip Infection When an infection is around the nail, it is called a paronychia. When it appears over the tip of the finger, it is called a felon. These infections are due to minor injuries or cracks in the skin. If they are not treated properly, they can lead to bone infection and permanent damage to the fingernail. Incision and drainage is necessary if a pus pocket (an abscess) has formed. Antibiotics and pain medicine may also be needed. Keep your hand elevated for the next 2-3 days to reduce swelling and pain. If a pack was placed in the abscess, it should be removed in 1-2 days by your caregiver. Soak the finger in warm water for 20 minutes 4 times daily to help promote drainage. Keep the hands as dry as possible. Wear protective gloves with cotton liners. See your caregiver for follow-up care as recommended.  HOME CARE INSTRUCTIONS   Keep wound clean, dry and dressed as suggested by your caregiver.  Soak in warm salt water for fifteen minutes, four times per day for bacterial infections.  Your caregiver will prescribe an antibiotic if a bacterial infection is suspected. Take antibiotics as directed and finish the prescription, even if the problem appears to be improving before the medicine is gone.  Only take over-the-counter or prescription medicines for pain, discomfort, or fever as directed by your caregiver. SEEK IMMEDIATE MEDICAL CARE IF:  There is redness, swelling, or increasing pain in the wound.  Pus or any other unusual drainage is coming from the wound.  An unexplained oral temperature above 102 F (38.9 C) develops.  You notice a foul smell coming from the wound or dressing. MAKE SURE YOU:   Understand these instructions.  Monitor your condition.  Contact your caregiver if you are getting worse or not improving.   This information is not intended to replace advice given to you by your health care provider. Make sure you discuss any questions you have with your  health care provider.   Document Released: 05/08/2004 Document Revised: 06/23/2011 Document Reviewed: 09/18/2014 Elsevier Interactive Patient Education 2016 Elsevier Inc.  

## 2015-05-20 NOTE — ED Notes (Signed)
Pt has a round area with drainage on his abdomen above his belly button. In addition, pt has similar area on left lower leg. Both areas started within the last week.

## 2015-05-20 NOTE — ED Notes (Signed)
Dressing  applied to both wounds.  °

## 2016-07-27 IMAGING — DX DG FOOT COMPLETE 3+V*R*
3 series · 3 of 3 positions shown · non-contrast
Comparison: None.

CLINICAL DATA: Right foot pain and swelling for 2 days. No known
injury.

EXAM:
RIGHT FOOT COMPLETE - 3+ VIEW

[foot ap]
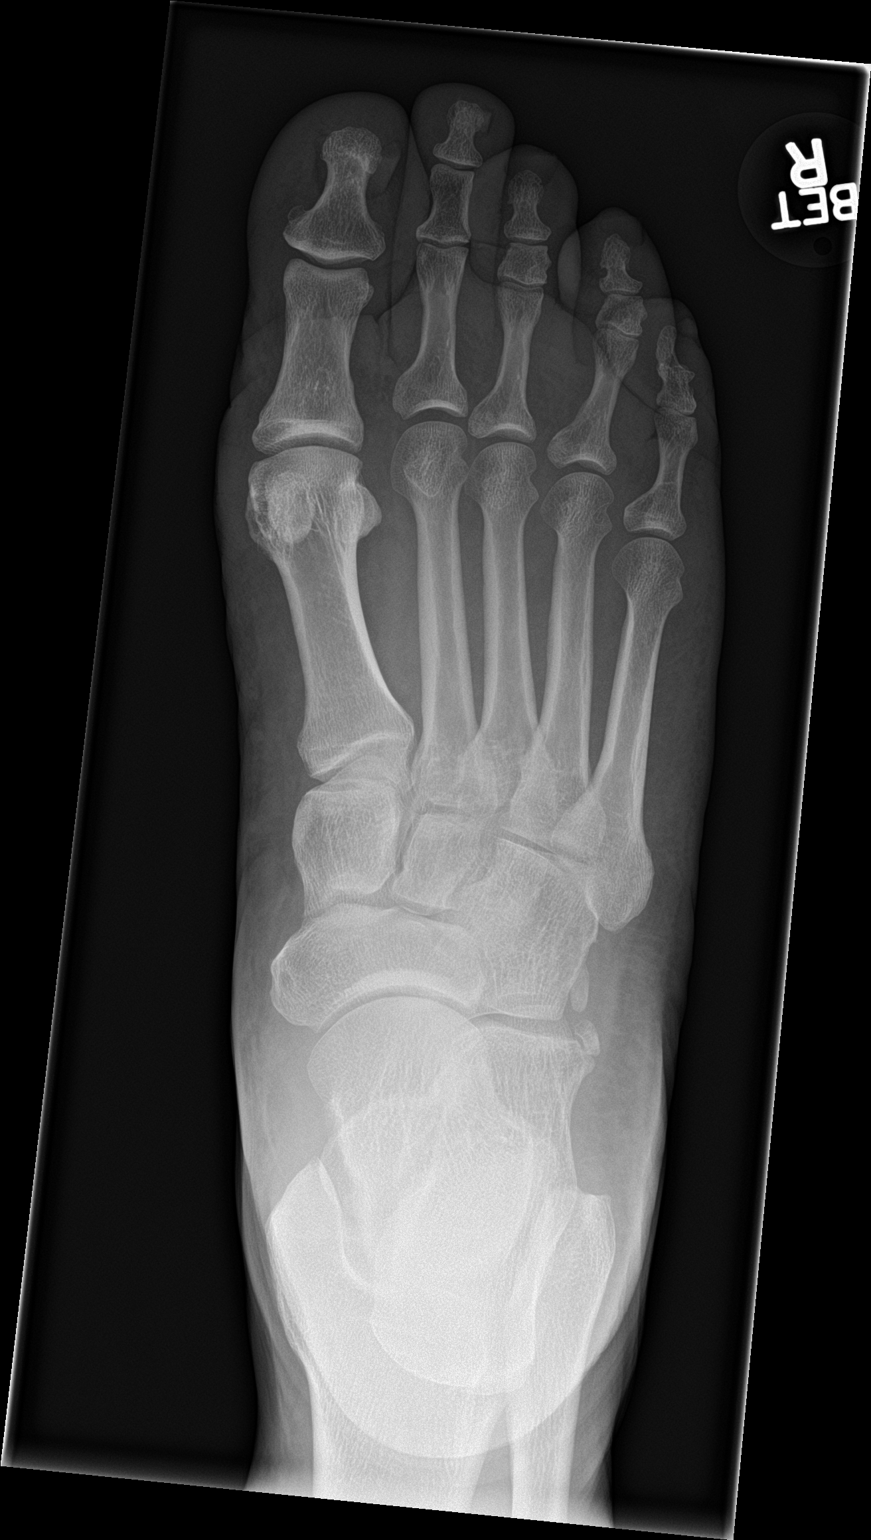

[foot obl]
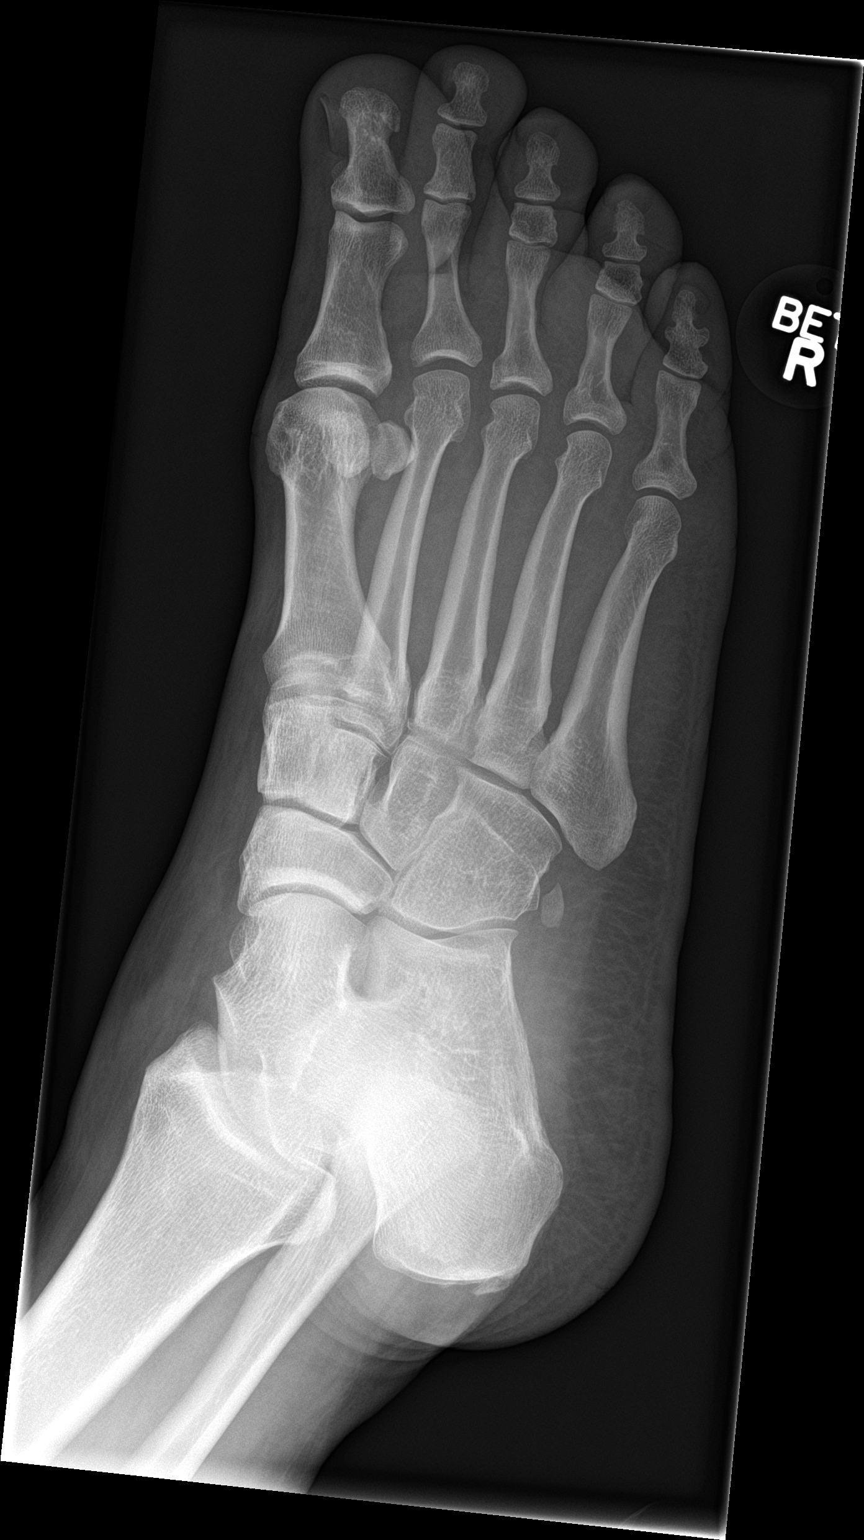

[foot lat]
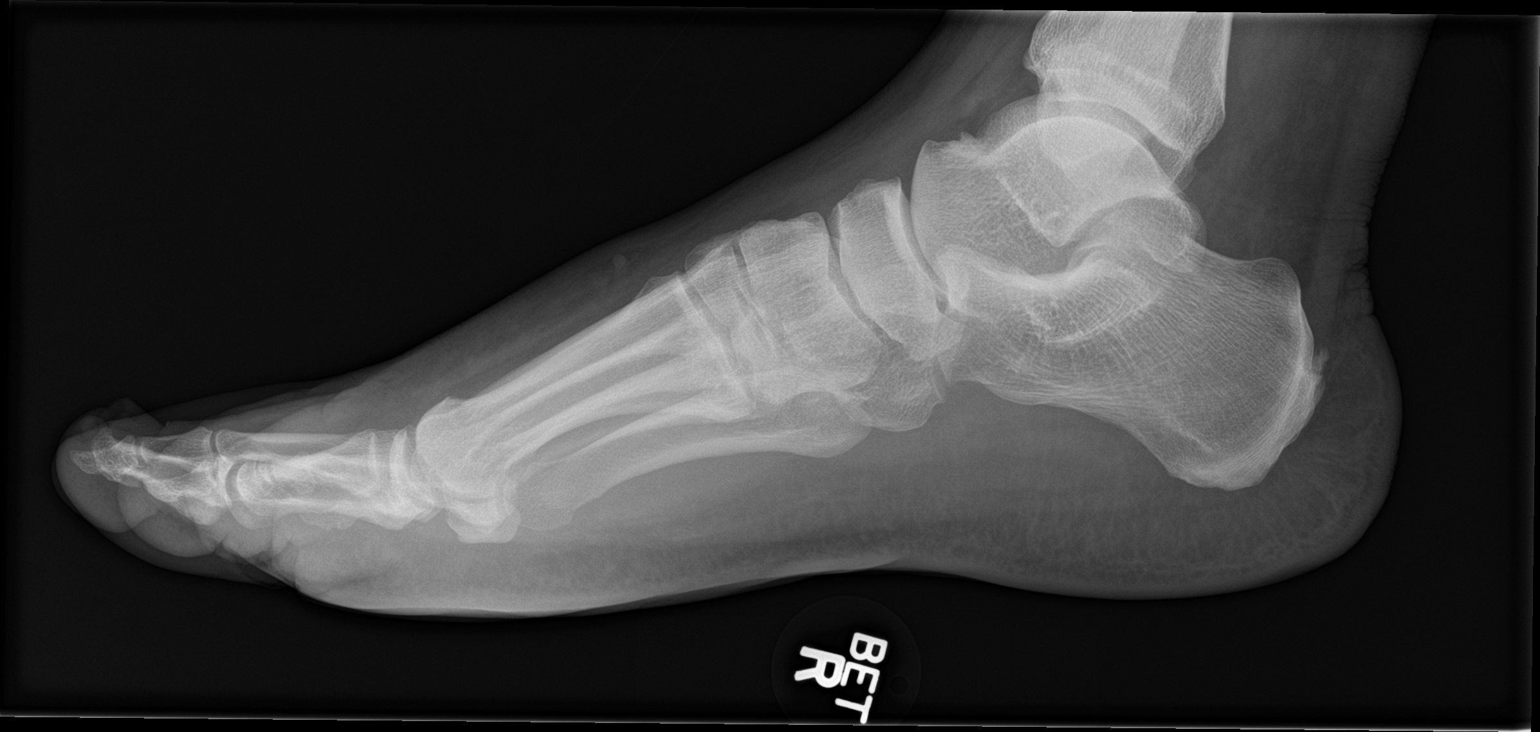

[3 of 3 positions shown; findings below may reference images not displayed]

FINDINGS: No fracture or dislocation. The alignment and joint spaces are
maintained. No erosion, periosteal reaction, or bony destructive
change. Os peroneal incidentally noted. Tiny Achilles tendon
enthesophyte. Mild soft tissue edema. No radiopaque foreign body.
IMPRESSION: Mild soft tissue edema.  No acute bony abnormality.

## 2023-02-20 ENCOUNTER — Encounter (HOSPITAL_COMMUNITY): Payer: Self-pay

## 2023-02-20 ENCOUNTER — Other Ambulatory Visit: Payer: Self-pay

## 2023-02-20 ENCOUNTER — Emergency Department (HOSPITAL_COMMUNITY)
Admission: EM | Admit: 2023-02-20 | Discharge: 2023-02-20 | Disposition: A | Discharge status: Home / Self Care | Payer: PRIVATE HEALTH INSURANCE | Attending: Emergency Medicine | Admitting: Emergency Medicine

## 2023-02-20 DIAGNOSIS — X58XXXA Exposure to other specified factors, initial encounter: Secondary | ICD-10-CM | POA: Diagnosis not present

## 2023-02-20 DIAGNOSIS — T161XXA Foreign body in right ear, initial encounter: Secondary | ICD-10-CM | POA: Diagnosis present

## 2023-02-20 NOTE — ED Triage Notes (Signed)
Pt stated that he has the rubber part of ear bud stuck in the RIGHT ear  Happened at 14:30

## 2023-02-20 NOTE — ED Provider Notes (Addendum)
[place] EMERGENCY DEPARTMENT AT [facility] Provider Note  CSN: [id number] Arrival date & time: 02/20/23 2841  Chief Complaint(s) Foreign Body in Ear  HPI Allen Patterson is a 57 y.o. male with past medical history as below, significant for no significant medical history who presents to the ED with complaint of fb right ear  Patient was wearing earbuds, earbud tip came off in his right ear.  He has some mild pressure to the ear, feels as though his hearing is muffled on the right side.  No significant pain to the ear.  No abnormalities to the left ear.  No other complaints.  No trauma.  Past Medical History History reviewed. No pertinent past medical history. There are no problems to display for this patient.  Home Medication(s) Prior to Admission medications   Medication Sig Start Date End Date Taking? Authorizing Provider  cephALEXin (KEFLEX) 500 MG capsule Take 1 capsule (500 mg total) by mouth 4 (four) times daily. Patient not taking: Reported on 05/20/2015 03/31/15   Burgess Amor, PA-C  diphenhydramine-acetaminophen (TYLENOL PM) 25-500 MG TABS tablet Take 1-2 tablets by mouth at bedtime as needed (for sleep). Reported on 05/20/2015    [provider]  doxycycline (VIBRAMYCIN) 100 MG capsule Take 1 capsule (100 mg total) by mouth 2 (two) times daily. Patient not taking: Reported on 03/31/2015 02/27/15   Vanetta Mulders, MD  HYDROcodone-acetaminophen (NORCO/VICODIN) 5-325 MG tablet Take 1 tablet by mouth every 4 (four) hours as needed. Patient not taking: Reported on 05/20/2015 03/31/15   Burgess Amor, PA-C  naproxen (NAPROSYN) 500 MG tablet Take 1 tablet (500 mg total) by mouth 2 (two) times daily. Patient not taking: Reported on 03/31/2015 02/27/15   Vanetta Mulders, MD  Phenyleph-Doxylamine-DM-APAP (NYQUIL SEVERE COLD/FLU) 5-6.25-10-325 MG/15ML LIQD Take 10 mLs by mouth 2 (two) times daily.    [provider]                                                                                                                                     Past Surgical History History reviewed. No pertinent surgical history. Family History History reviewed. No pertinent family history.  Social History Social History   Tobacco Use   Smoking status: Never   Smokeless tobacco: Never  Substance Use Topics   Alcohol use: No   Drug use: No   Allergies Patient has no known allergies.  Review of Systems Review of Systems  Constitutional:  Negative for chills and fever.  HENT:  Positive for hearing loss. Negative for ear discharge, ear pain, facial swelling and trouble swallowing.   All other systems reviewed and are negative.   Physical Exam Vital Signs  I have reviewed the triage vital signs BP (!) 177/128 (BP Location: Right Arm)   Pulse 77   Temp 99.4 F (37.4 C) (Oral)   Resp 20   Ht 5\' 11"  (1.803 m)   Wt 97.5  kg   SpO2 97%   BMI 29.99 kg/m  Physical Exam Vitals and nursing note reviewed.  Constitutional:      General: He is not in acute distress.    Appearance: Normal appearance. He is well-developed. He is not ill-appearing.  HENT:     Head: Normocephalic and atraumatic. No right periorbital erythema or left periorbital erythema.     Right Ear: External ear normal. A foreign body is present.     Left Ear: External ear normal. No foreign body.     Ears:     Comments: Foreign body noted to right ear, left ear normal    Nose: Nose normal.     Mouth/Throat:     Mouth: Mucous membranes are moist.  Eyes:     General: No scleral icterus.       Right eye: No discharge.        Left eye: No discharge.  Cardiovascular:     Rate and Rhythm: Normal rate.  Pulmonary:     Effort: Pulmonary effort is normal. No respiratory distress.     Breath sounds: No stridor.  Abdominal:     General: Abdomen is flat. There is no distension.     Tenderness: There is no guarding.  Musculoskeletal:        General: No deformity.     Cervical back: No  rigidity.  Skin:    General: Skin is warm and dry.     Coloration: Skin is not cyanotic, jaundiced or pale.  Neurological:     Mental Status: He is alert and oriented to person, place, and time.     GCS: GCS eye subscore is 4. GCS verbal subscore is 5. GCS motor subscore is 6.  Psychiatric:        Speech: Speech normal.        Behavior: Behavior normal. Behavior is cooperative.     ED Results and Treatments Labs (all labs ordered are listed, but only abnormal results are displayed) Labs Reviewed - No data to display                                                                                                                        Radiology No results found.  Pertinent labs & imaging results that were available during my care of the patient were reviewed by me and considered in my medical decision making (see MDM for details).  Medications Ordered in ED Medications - No data to display  Procedures .Foreign Body Removal  Date/Time: 02/20/2023 7:43 PM  Performed by: Sloan Leiter, DO Authorized by: Sloan Leiter, DO  Consent: Verbal consent obtained. Risks and benefits: risks, benefits and alternatives were discussed Consent given by: patient Patient understanding: patient states understanding of the procedure being performed Imaging studies: imaging studies available Required items: required blood products, implants, devices, and special equipment available Patient identity confirmed: verbally with patient and arm band Time out: Immediately prior to procedure a "time out" was called to verify the correct patient, procedure, equipment, support staff and site/side marked as required. Body area: ear Location details: right ear  Sedation: Patient sedated: no  Patient restrained: no Patient cooperative: yes Localization method:  visualized Removal mechanism: forceps Complexity: simple 1 objects recovered. Objects recovered: green/blue earbud tip, intact Post-procedure assessment: foreign body removed Patient tolerance: patient tolerated the procedure well with no immediate complications Comments: Intact fb removed, symptoms resolved, no residual fb noted on exam, no tm perf    (including critical care time)  Medical Decision Making / ED Course    Medical Decision Making:    Quill Rajaram is a 57 y.o. male with past medical history as below, significant for no significant medical history who presents to the ED with complaint of fb right ear. The complaint involves an extensive differential diagnosis and also carries with it a high risk of complications and morbidity.  Serious etiology was considered. Ddx includes but is not limited to: Foreign body, infectious, TM perforation, etc.  Complete initial physical exam performed, notably the patient  was no acute distress, sitting upright in.    Reviewed and confirmed nursing documentation for past medical history, family history, social history.  Vital signs reviewed.        Foreign body to right ear, removed successfully, see note.  Symptoms improved.  Follow-up PCP, avoid sticking anything in the ear in the future  The patient improved significantly and was discharged in stable condition. Detailed discussions were had with the patient regarding current findings, and need for close f/u with PCP or on call doctor. The patient has been instructed to return immediately if the symptoms worsen in any way for re-evaluation. Patient verbalized understanding and is in agreement with current care plan. All questions answered prior to discharge.                   Additional history obtained: -Additional history obtained from na -External records from outside source obtained and reviewed including: Chart review including previous notes, labs, imaging,  consultation notes including  Prior ED visits   Lab Tests: na  EKG   EKG Interpretation Date/Time:    Ventricular Rate:    PR Interval:    QRS Duration:    QT Interval:    QTC Calculation:   R Axis:      Text Interpretation:           Imaging Studies ordered: na   Medicines ordered and prescription drug management: No orders of the defined types were placed in this encounter.   -I have reviewed the patients home medicines and have made adjustments as needed   Consultations Obtained: na   Cardiac Monitoring: na    Social Determinants of Health:  Diagnosis or treatment significantly limited by social determinants of health: no pcp   Reevaluation: After the interventions noted above, I reevaluated the patient and found that they have resolved  Co morbidities that complicate the patient evaluation History reviewed. No pertinent past medical  history.    Dispostion: Disposition decision including need for hospitalization was considered, and patient discharged from emergency department.    Final Clinical Impression(s) / ED Diagnoses Final diagnoses:  Foreign body of right ear, initial encounter        Sloan Leiter, DO 02/20/23 1942    Sloan Leiter, DO 02/20/23 1944

## 2023-02-20 NOTE — Discharge Instructions (Signed)
It was a pleasure caring for you today in the emergency department. ° °Please return to the emergency department for any worsening or worrisome symptoms. ° ° °
